# Patient Record
Sex: Female | Born: 1960 | Race: White | Hispanic: No | Marital: Married | Smoking: Former smoker
Health system: Southern US, Community
[De-identification: ages and names within clinical notes are randomized; demographics above are authoritative.]

---

## 2005-09-19 ENCOUNTER — Emergency Department: Payer: Self-pay | Admitting: Emergency Medicine

## 2006-10-30 IMAGING — CR DG RIBS 2V*L*
1 series · 3 of 3 positions shown · non-contrast
Comparison: none

REASON FOR EXAM: fall
COMMENTS:

PROCEDURE:     DXR - DXR RIBS LEFT UNILATERAL  - September 19, 2005 [DATE]
RESULT:          The patient sustained a fracture of the lateral aspect of
the LEFT 8th rib.  The 7th and 9th ribs are intact.  There is no pleural
effusion identified.  I see no pneumothorax.

[Series 1: view not recorded · 0.17mm/px · 3 of 3 slices shown]
[im 1/3]
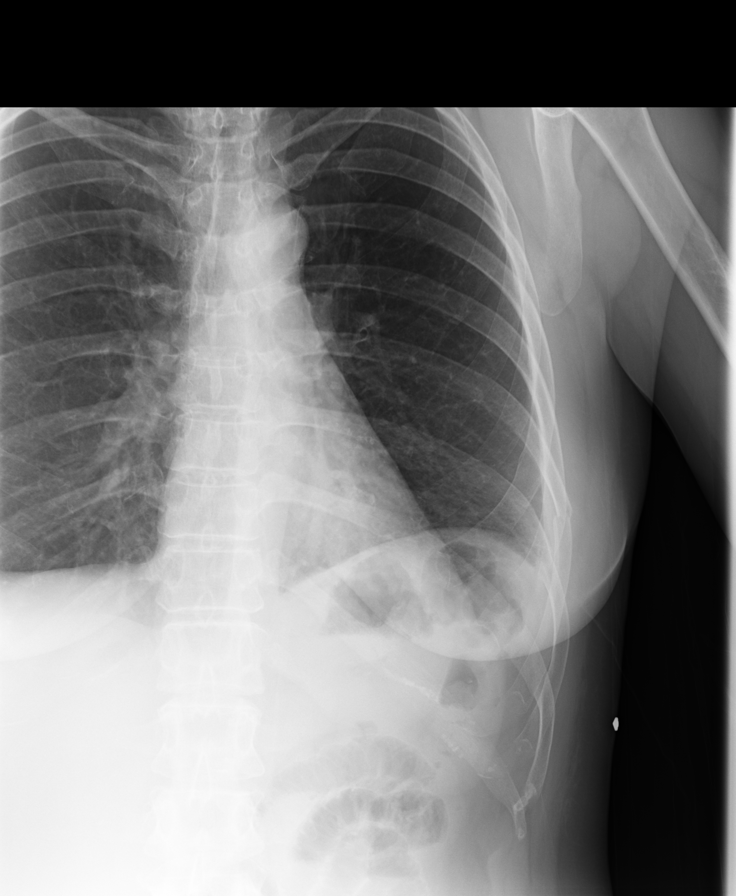
[im 2/3]
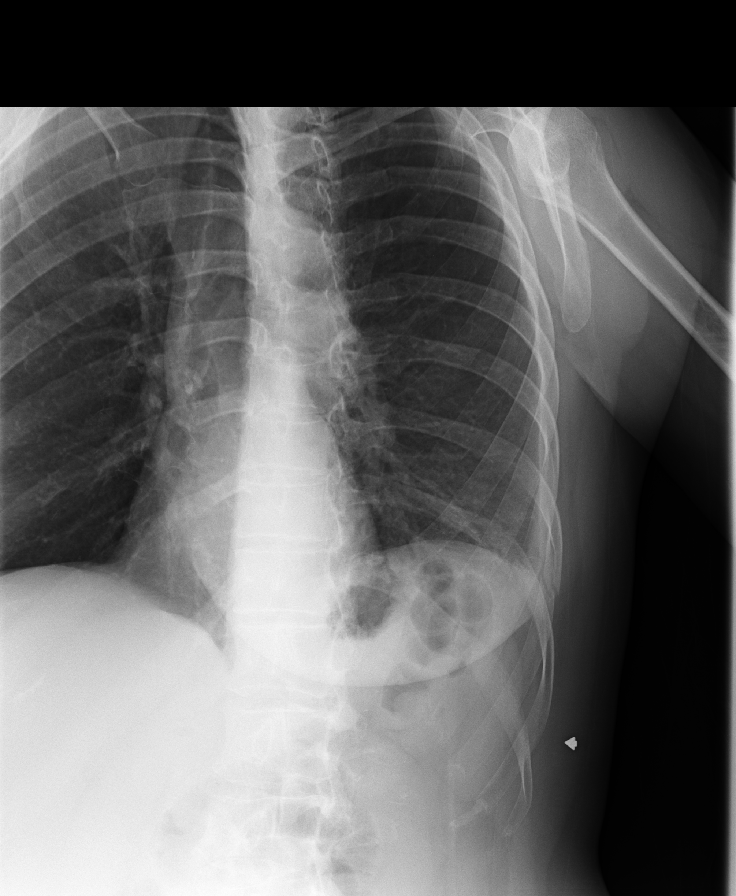
[im 3/3]
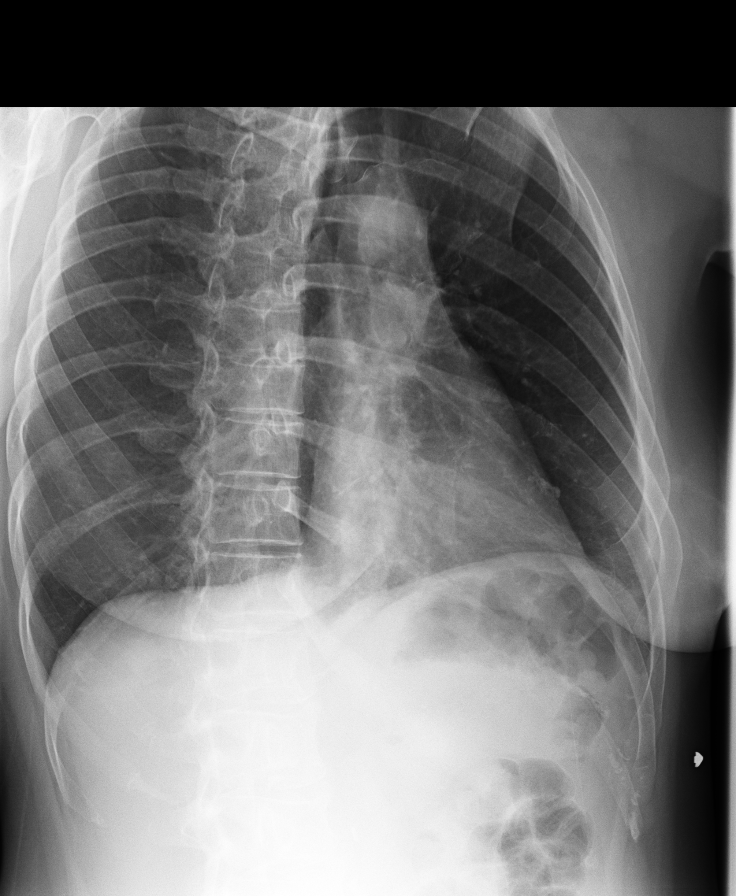

[3 of 3 positions shown; findings below may reference images not displayed]

IMPRESSION: The patient has sustained a fracture of the lateral
aspect of the LEFT 8th rib.

## 2017-10-17 ENCOUNTER — Other Ambulatory Visit: Payer: Self-pay

## 2017-10-17 ENCOUNTER — Emergency Department
Admission: EM | Admit: 2017-10-17 | Discharge: 2017-10-17 | Disposition: A | Discharge status: Home / Self Care | Payer: Self-pay | Attending: Emergency Medicine | Admitting: Emergency Medicine

## 2017-10-17 DIAGNOSIS — Z77098 Contact with and (suspected) exposure to other hazardous, chiefly nonmedicinal, chemicals: Secondary | ICD-10-CM | POA: Insufficient documentation

## 2017-10-17 DIAGNOSIS — Z87891 Personal history of nicotine dependence: Secondary | ICD-10-CM | POA: Insufficient documentation

## 2017-10-17 DIAGNOSIS — J3489 Other specified disorders of nose and nasal sinuses: Secondary | ICD-10-CM | POA: Insufficient documentation

## 2017-10-17 DIAGNOSIS — R0981 Nasal congestion: Secondary | ICD-10-CM | POA: Insufficient documentation

## 2017-10-17 DIAGNOSIS — R51 Headache: Secondary | ICD-10-CM | POA: Insufficient documentation

## 2017-10-17 DIAGNOSIS — R070 Pain in throat: Secondary | ICD-10-CM | POA: Insufficient documentation

## 2017-10-17 MED ORDER — PREDNISONE 10 MG PO TABS: 50.0000 mg | ORAL_TABLET | Freq: Every day | ORAL | 0 refills | Status: DC

## 2017-10-17 MED ORDER — NAPROXEN 500 MG PO TABS: 500.0000 mg | ORAL_TABLET | Freq: Two times a day (BID) | ORAL | 0 refills | Status: DC | PRN

## 2017-10-17 MED ORDER — LIDOCAINE VISCOUS HCL 2 % MT SOLN
15.0000 mL | Freq: Once | OROMUCOSAL | Status: AC
  Administered 2017-10-17: 15 mL via OROMUCOSAL
  Filled 2017-10-17: qty 15

## 2017-10-17 MED ORDER — ACETAMINOPHEN 325 MG PO TABS
650.0000 mg | ORAL_TABLET | Freq: Once | ORAL | Status: AC
  Administered 2017-10-17: 650 mg via ORAL
  Filled 2017-10-17: qty 2

## 2017-10-17 MED ORDER — PREDNISONE 20 MG PO TABS
60.0000 mg | ORAL_TABLET | Freq: Once | ORAL | Status: AC
  Administered 2017-10-17: 60 mg via ORAL
  Filled 2017-10-17: qty 3

## 2017-10-17 MED ORDER — LIDOCAINE VISCOUS HCL 2 % MT SOLN: 15.0000 mL | OROMUCOSAL | 0 refills | Status: DC | PRN

## 2017-10-17 NOTE — ED Triage Notes (Addendum)
Pt arrives to ED via POV from home with c/o headache and "burning in my throat" that started 1.5 hrs PTA. Pt denies any c/o N/V/D or fever. No visual changes, no tinnitus; no facial droop or unilateral weakness observed. Pt denies any difficulty breathing, talking, or swallowing. Pt also states she was using ammonia today when "cleaning up after a new puppy" before the s/x's began today.

## 2017-10-17 NOTE — Discharge Instructions (Addendum)
Please follow up with the primary care provider of your choice for symptoms that are not improving over the next few days. ° °Return to the ER for symptoms that change or worsen if unable to schedule an appointment. °

## 2017-10-17 NOTE — ED Notes (Signed)
Report given to Dajea RN. 

## 2017-10-17 NOTE — ED Provider Notes (Signed)
-----------------------------------------   11:34 PM on 10/17/2017 -----------------------------------------  I evaluated this patient with the FNP.  Patient reported throat discomfort after inhaling ammonia that she was using to clean.  On my exam, the patient's vital signs were normal, oropharynx was clear, there is no stridor, no pooled secretions, or other acute abnormalities.  Suspect most likely mild pharyngeal irritation due to the fumes.  We gave viscous lidocaine with improvement in the patient's symptoms.  No evidence for pneumonitis or other acute complications, and no indication for further ED work-up.   Dionne BucySiadecki, Syrenity Klepacki, MD 10/17/17 [phone]

## 2017-10-17 NOTE — ED Provider Notes (Signed)
[facility] Emergency Department Provider Note   ____________________________________________   None    (approximate)  I have reviewed the triage vital signs and the nursing notes.   HISTORY  Chief Complaint Headache and Sore Throat    HPI Maria Meyers is a 57 y.o. female who presents to the emergency department for treatment and evaluation of headache and throat burning that started about 2 hours ago.  Patient states that symptoms started just after cleaning with ammonia.  She had felt well until that time.  She states that she has had some rhinorrhea with blood streaks, sinus congestion, and pharyngitis.  She has not taken any medications or attempted any alleviating measures prior to arrival.  History reviewed. No pertinent past medical history.  There are no active problems to display for this patient.   Past Surgical History:  Procedure Laterality Date  . CESAREAN SECTION      Prior to Admission medications   Medication Sig Start Date End Date Taking? Authorizing Provider  lidocaine (XYLOCAINE) 2 % solution Use as directed 15 mLs in the mouth or throat as needed (throat irritation). 10/17/17   Asante Ritacco, Rulon Eisenmengerari B, FNP  naproxen (NAPROSYN) 500 MG tablet Take 1 tablet (500 mg total) by mouth 2 (two) times daily as needed for headache. 10/17/17   Johnchristopher Sarvis B, FNP  predniSONE (DELTASONE) 10 MG tablet Take 5 tablets (50 mg total) by mouth daily. 10/17/17   Kem Boroughsriplett, Randell Teare B, FNP    Allergies Codeine  No family history on file.  Social History Social History   Tobacco Use  . Smoking status: Former Games developermoker  . Smokeless tobacco: Current User  Substance Use Topics  . Alcohol use: Not on file  . Drug use: Not on file    Review of Systems  Constitutional: No fever/chills Eyes: No visual changes. ENT: Positive for sore throat. Cardiovascular: Denies chest pain. Respiratory: Denies shortness of breath. Gastrointestinal: No abdominal pain.   No nausea, no vomiting. Genitourinary: Negative for dysuria. Musculoskeletal: Negative for back pain. Skin: Negative for rash, lesion, or wound. Neurological: Positive for headaches,  Negative for focal weakness or numbness. ____________________________________________   PHYSICAL EXAM:  VITAL SIGNS: ED Triage Vitals  Enc Vitals Group     BP 10/17/17 2107 (!) 160/79     Pulse Rate 10/17/17 2107 (!) 57     Resp 10/17/17 2107 18     Temp 10/17/17 2107 98.1 F (36.7 C)     Temp Source 10/17/17 2107 Oral     SpO2 10/17/17 2107 98 %     Weight 10/17/17 2108 112 lb (50.8 kg)     Height 10/17/17 2108 5\' 3"  (1.6 m)     Head Circumference --      Peak Flow --      Pain Score 10/17/17 2108 5     Pain Loc --      Pain Edu? --      Excl. in GC? --     Constitutional: Alert and oriented. Well appearing and in no acute distress. Eyes: Conjunctivae are normal. PERRL. Head: Atraumatic. Nose: Sinus congestion without rhinnorhea. Mouth/Throat: Mucous membranes are moist.  Oropharynx non-erythematous. Airway is patent. Modified Mallampati Class II. Neck: No stridor.   Hematological/Lymphatic/Immunilogical: No cervical lymphadenopathy. Cardiovascular: Normal rate, regular rhythm. Grossly normal heart sounds.  Good peripheral circulation. Respiratory: Normal respiratory effort.  No retractions. Lungs CTAB. Gastrointestinal: Soft and nontender. No distention. No abdominal bruits. Musculoskeletal: No lower extremity tenderness nor edema.  No joint effusions. Neurologic:  Normal speech and language. No gross focal neurologic deficits are appreciated. No gait instability. Swallows on command without difficulty.  Skin:  Skin is warm, dry and intact. No rash noted. Psychiatric: Mood and affect are normal. Speech and behavior are normal.  ____________________________________________   LABS (all labs ordered are listed, but only abnormal results are displayed)  Labs Reviewed - No data to  display ____________________________________________  EKG  Not indicated. ____________________________________________  RADIOLOGY  ED MD interpretation: Not indicated  Official radiology report(s): No results found.  ____________________________________________   PROCEDURES  Procedure(s) performed: None  Procedures  Critical Care performed: No  ____________________________________________   INITIAL IMPRESSION / ASSESSMENT AND PLAN / ED COURSE  As part of my medical decision making, I reviewed the following data within the electronic MEDICAL RECORD NUMBER    57 year old female presenting to the emergency department for treatment and evaluation after inhaling some ammonia while cleaning up a mess her dog made.  While here, she was given viscous lidocaine and Tylenol.  She had resolution of her headache and some improvement of throat irritation.  Prior to discharge, she was given prednisone and will be given a prescription for the same.  She will also be given a prescription for the viscous lidocaine.  She was encouraged to follow-up with her primary care provider for symptoms that are not improving over the next 24 to 48 hours.  She was encouraged to return to return to the emergency department for symptoms of change or worsen if she is unable to schedule an appointment.      ____________________________________________   FINAL CLINICAL IMPRESSION(S) / ED DIAGNOSES  Final diagnoses:  Chemical exposure     ED Discharge Orders        Ordered    lidocaine (XYLOCAINE) 2 % solution  As needed     10/17/17 2324    naproxen (NAPROSYN) 500 MG tablet  2 times daily PRN     10/17/17 2324    predniSONE (DELTASONE) 10 MG tablet  Daily     10/17/17 2326       Note:  This document was prepared using Dragon voice recognition software and may include unintentional dictation errors.    Chinita Pester, FNP 10/18/17 1610    Dionne Bucy, MD 10/20/17 0004

## 2020-05-28 ENCOUNTER — Other Ambulatory Visit: Payer: Self-pay

## 2020-05-28 ENCOUNTER — Emergency Department: Payer: Self-pay

## 2020-05-28 ENCOUNTER — Encounter: Payer: Self-pay | Admitting: Emergency Medicine

## 2020-05-28 ENCOUNTER — Emergency Department
Admission: EM | Admit: 2020-05-28 | Discharge: 2020-05-28 | Disposition: A | Discharge status: Home / Self Care | Payer: Self-pay | Attending: Student in an Organized Health Care Education/Training Program | Admitting: Student in an Organized Health Care Education/Training Program

## 2020-05-28 DIAGNOSIS — Z87891 Personal history of nicotine dependence: Secondary | ICD-10-CM | POA: Insufficient documentation

## 2020-05-28 DIAGNOSIS — R072 Precordial pain: Secondary | ICD-10-CM | POA: Insufficient documentation

## 2020-05-28 DIAGNOSIS — R61 Generalized hyperhidrosis: Secondary | ICD-10-CM | POA: Insufficient documentation

## 2020-05-28 DIAGNOSIS — R079 Chest pain, unspecified: Secondary | ICD-10-CM

## 2020-05-28 DIAGNOSIS — R200 Anesthesia of skin: Secondary | ICD-10-CM | POA: Insufficient documentation

## 2020-05-28 LAB — COMPREHENSIVE METABOLIC PANEL
ALT: 18 U/L (ref 0–44)
AST: 24 U/L (ref 15–41)
Albumin: 4 g/dL (ref 3.5–5.0)
Alkaline Phosphatase: 68 U/L (ref 38–126)
Anion gap: 7 (ref 5–15)
BUN: 16 mg/dL (ref 8–23)
CO2: 26 mmol/L (ref 22–32)
Calcium: 9.2 mg/dL (ref 8.9–10.3)
Chloride: 105 mmol/L (ref 98–111)
Creatinine, Ser: 0.77 mg/dL (ref 0.44–1.00)
GFR, Estimated: >60 mL/min (ref >60)
Glucose, Bld: 88 mg/dL (ref 70–99)
Potassium: 4.1 mmol/L (ref 3.5–5.1)
Sodium: 138 mmol/L (ref 135–145)
Total Bilirubin: 0.7 mg/dL (ref 0.3–1.2)
Total Protein: 7.1 g/dL (ref 6.5–8.1)

## 2020-05-28 LAB — CBC WITH DIFFERENTIAL/PLATELET
Abs Immature Granulocytes: 0.01 10*3/uL (ref 0.00–0.07)
Basophils Absolute: 0 10*3/uL (ref 0.0–0.1)
Basophils Relative: 1 %
Eosinophils Absolute: 0.2 10*3/uL (ref 0.0–0.5)
Eosinophils Relative: 4 %
HCT: 41.9 % (ref 36.0–46.0)
Hemoglobin: 14.1 g/dL (ref 12.0–15.0)
Immature Granulocytes: 0 %
Lymphocytes Relative: 31 %
Lymphs Abs: 1.3 10*3/uL (ref 0.7–4.0)
MCH: 33.5 pg (ref 26.0–34.0)
MCHC: 33.7 g/dL (ref 30.0–36.0)
MCV: 99.5 fL (ref 80.0–100.0)
Monocytes Absolute: 0.5 10*3/uL (ref 0.1–1.0)
Monocytes Relative: 11 %
Neutro Abs: 2.1 10*3/uL (ref 1.7–7.7)
Neutrophils Relative %: 53 %
Platelets: 240 10*3/uL (ref 150–400)
RBC: 4.21 MIL/uL (ref 3.87–5.11)
RDW: 12.1 % (ref 11.5–15.5)
WBC: 4.1 10*3/uL (ref 4.0–10.5)
nRBC: 0 % (ref 0.0–0.2)

## 2020-05-28 LAB — TROPONIN I (HIGH SENSITIVITY)
Troponin I (High Sensitivity): 2 ng/L (ref <18)
Troponin I (High Sensitivity): 2 ng/L (ref <18)

## 2020-05-28 MED ORDER — AMLODIPINE BESYLATE 5 MG PO TABS
5.0000 mg | ORAL_TABLET | Freq: Once | ORAL | Status: AC
  Administered 2020-05-28: 5 mg via ORAL
  Filled 2020-05-28: qty 1

## 2020-05-28 MED ORDER — IOHEXOL 350 MG/ML SOLN
75.0000 mL | Freq: Once | INTRAVENOUS | Status: AC | PRN
  Administered 2020-05-28: 75 mL via INTRAVENOUS
  Filled 2020-05-28: qty 75

## 2020-05-28 MED ORDER — ASPIRIN 81 MG PO CHEW
324.0000 mg | CHEWABLE_TABLET | Freq: Once | ORAL | Status: AC
  Administered 2020-05-28: 324 mg via ORAL
  Filled 2020-05-28: qty 4

## 2020-05-28 MED ORDER — AMLODIPINE BESYLATE 5 MG PO TABS: 5.0000 mg | ORAL_TABLET | Freq: Every day | ORAL | 1 refills | Status: DC

## 2020-05-28 NOTE — ED Notes (Signed)
Pt co cp and numbness in the left arm. She states that the pain work her up at 4am. The pt says the numbness has happened before but went away, denies no other symptoms with the pain.

## 2020-05-28 NOTE — ED Provider Notes (Signed)
[facility] Emergency Department Provider Note    Event Date/Time   First MD Initiated Contact with Patient 05/28/20 [phone]     (approximate)  I have reviewed the triage vital signs and the nursing notes.   HISTORY  Chief Complaint Chest Pain    HPI SOLE LENGACHER is a 60 y.o. female no significant past medical history other than history of smoking presents to the ER for evaluation of midsternal chest pain and pressure associated with left arm heaviness and numbness pain the symptoms awoke her from sleep around 4 AM and lasted until 6.  She did have associated diaphoresis.  States the pain did abate by itself.  States that she woke up with the diaphoresis and was not feeling clammy or diaphoretic for the entire duration of the pain.  States the pain would get worse and then abate get worse and then abate several times over the course of 2 hours.  Has never had pain like this before.    History reviewed. No pertinent past medical history. No family history on file. Past Surgical History:  Procedure Laterality Date  . CESAREAN SECTION     There are no problems to display for this patient.     Prior to Admission medications   Medication Sig Start Date End Date Taking? Authorizing Provider  lidocaine (XYLOCAINE) 2 % solution Use as directed 15 mLs in the mouth or throat as needed (throat irritation). 10/17/17   Triplett, Rulon Eisenmenger B, FNP  naproxen (NAPROSYN) 500 MG tablet Take 1 tablet (500 mg total) by mouth 2 (two) times daily as needed for headache. 10/17/17   Triplett, Cari B, FNP  predniSONE (DELTASONE) 10 MG tablet Take 5 tablets (50 mg total) by mouth daily. 10/17/17   Chinita Pester, FNP    Allergies Codeine    Social History Social History   Tobacco Use  . Smoking status: Former Games developer  . Smokeless tobacco: Current User  Vaping Use  . Vaping Use: Every day    Review of Systems Patient denies headaches, rhinorrhea, blurry vision, numbness,  shortness of breath, chest pain, edema, cough, abdominal pain, nausea, vomiting, diarrhea, dysuria, fevers, rashes or hallucinations unless otherwise stated above in HPI. ____________________________________________   PHYSICAL EXAM:  VITAL SIGNS: Vitals:   05/28/20 0629  BP: (!) 161/89  Pulse: 67  Resp: 18  Temp: 98 F (36.7 C)  SpO2: 100%    Constitutional: Alert and oriented.  Eyes: Conjunctivae are normal.  Head: Atraumatic. Nose: No congestion/rhinnorhea. Mouth/Throat: Mucous membranes are moist.   Neck: No stridor. Painless ROM.  Cardiovascular: Normal rate, regular rhythm. Grossly normal heart sounds.  Good peripheral circulation. Respiratory: Normal respiratory effort.  No retractions. Lungs CTAB. Gastrointestinal: Soft and nontender. No distention. No abdominal bruits. No CVA tenderness. Genitourinary:  Musculoskeletal: No lower extremity tenderness nor edema.  No joint effusions. Neurologic:  Normal speech and language. No gross focal neurologic deficits are appreciated. No facial droop Skin:  Skin is warm, dry and intact. No rash noted. Psychiatric: Mood and affect are normal. Speech and behavior are normal.  ____________________________________________   LABS (all labs ordered are listed, but only abnormal results are displayed)  Results for orders placed or performed during the hospital encounter of 05/28/20 (from the past 24 hour(s))  CBC with Differential     Status: None   Collection Time: 05/28/20  6:34 AM  Result Value Ref Range   WBC 4.1 4.0 - 10.5 K/uL   RBC 4.21 3.87 -  5.11 MIL/uL   Hemoglobin 14.1 12.0 - 15.0 g/dL   HCT 41.3 24.4 - 01.0 %   MCV 99.5 80.0 - 100.0 fL   MCH 33.5 26.0 - 34.0 pg   MCHC 33.7 30.0 - 36.0 g/dL   RDW 27.2 53.6 - 64.4 %   Platelets 240 150 - 400 K/uL   nRBC 0.0 0.0 - 0.2 %   Neutrophils Relative % 53 %   Neutro Abs 2.1 1.7 - 7.7 K/uL   Lymphocytes Relative 31 %   Lymphs Abs 1.3 0.7 - 4.0 K/uL   Monocytes Relative 11 %    Monocytes Absolute 0.5 0.1 - 1.0 K/uL   Eosinophils Relative 4 %   Eosinophils Absolute 0.2 0.0 - 0.5 K/uL   Basophils Relative 1 %   Basophils Absolute 0.0 0.0 - 0.1 K/uL   Immature Granulocytes 0 %   Abs Immature Granulocytes 0.01 0.00 - 0.07 K/uL  Comprehensive metabolic panel     Status: None   Collection Time: 05/28/20  6:34 AM  Result Value Ref Range   Sodium 138 135 - 145 mmol/L   Potassium 4.1 3.5 - 5.1 mmol/L   Chloride 105 98 - 111 mmol/L   CO2 26 22 - 32 mmol/L   Glucose, Bld 88 70 - 99 mg/dL   BUN 16 8 - 23 mg/dL   Creatinine, Ser 0.34 0.44 - 1.00 mg/dL   Calcium 9.2 8.9 - 74.2 mg/dL   Total Protein 7.1 6.5 - 8.1 g/dL   Albumin 4.0 3.5 - 5.0 g/dL   AST 24 15 - 41 U/L   ALT 18 0 - 44 U/L   Alkaline Phosphatase 68 38 - 126 U/L   Total Bilirubin 0.7 0.3 - 1.2 mg/dL   GFR, Estimated >59 >56 mL/min   Anion gap 7 5 - 15  Troponin I (High Sensitivity)     Status: None   Collection Time: 05/28/20  6:34 AM  Result Value Ref Range   Troponin I (High Sensitivity) 2 <18 ng/L   ____________________________________________  EKG My review and personal interpretation at Time: 6:26   Indication: chestpain  Rate: 65  Rhythm: sinus Axis: normal Other: normal intervals, no stemi ____________________________________________  RADIOLOGY  I personally reviewed all radiographic images ordered to evaluate for the above acute complaints and reviewed radiology reports and findings.  These findings were personally discussed with the patient.  Please see medical record for radiology report.  ____________________________________________   PROCEDURES  Procedure(s) performed:  Procedures    Critical Care performed: no ____________________________________________   INITIAL IMPRESSION / ASSESSMENT AND PLAN / ED COURSE  Pertinent labs & imaging results that were available during my care of the patient were reviewed by me and considered in my medical decision making (see chart  for details).   DDX: ACS, pericarditis, pe, dissection, pna, bronchitis, costochondritis   DORSIE SETHI is a 60 y.o. who presents to the ED with presentation as described above.  Patient is currently pain-free nontoxic-appearing.  EKG appears normal initial troponin is negative.  No respiratory distress no hypoxia.  Chest x-ray with no evidence of pneumothorax or infiltrate.  Patient her pain going through to her neck and arm, described numbness will order CTA to evaluate for acute aortic pathology.  Clinical Course as of 05/28/20 0933  Wed May 28, 2020  [id number] CTA fortunately shows no evidence of acute abnormality but does have some aortic atherosclerosis.  Given her age risk factors and description of event I am in  a consult with cardiology.  Serial enzymes are negative therefore have a lower suspicion for ACS. [PR]  [phone] Patient remains pain-free.  Discussed case in consultation with Dr. Gwen Pounds.  Agrees to see patient in close outpatient follow-up.  Patient demonstrates understanding agreeable with plan. [PR]    Clinical Course User Index [PR] Willy Eddy, MD    The patient was evaluated in Emergency Department today for the symptoms described in the history of present illness. He/she was evaluated in the context of the global COVID-19 pandemic, which necessitated consideration that the patient might be at risk for infection with the SARS-CoV-2 virus that causes COVID-19. Institutional protocols and algorithms that pertain to the evaluation of patients at risk for COVID-19 are in a state of rapid change based on information released by regulatory bodies including the CDC and federal and state organizations. These policies and algorithms were followed during the patient's care in the ED.  As part of my medical decision making, I reviewed the following data within the electronic MEDICAL RECORD NUMBER Nursing notes reviewed and incorporated, Labs reviewed, notes from prior ED visits and [place]  Controlled Substance Database   ____________________________________________   FINAL CLINICAL IMPRESSION(S) / ED DIAGNOSES  Final diagnoses:  Chest pain, unspecified type      NEW MEDICATIONS STARTED DURING THIS VISIT:  New Prescriptions   No medications on file     Note:  This document was prepared using Dragon voice recognition software and may include unintentional dictation errors.    Willy Eddy, MD 05/28/20 [phone]

## 2020-05-28 NOTE — ED Triage Notes (Signed)
Pt to triage via w/c with no distress noted; st awoke at 4am with upper CP radiating into left arm & neck; denies any accomp symptoms, denies hx of same

## 2020-05-29 DIAGNOSIS — I10 Essential (primary) hypertension: Secondary | ICD-10-CM | POA: Insufficient documentation

## 2020-05-29 DIAGNOSIS — I2089 Other forms of angina pectoris: Secondary | ICD-10-CM | POA: Insufficient documentation

## 2020-05-30 ENCOUNTER — Other Ambulatory Visit: Payer: Self-pay | Admitting: Internal Medicine

## 2020-05-30 DIAGNOSIS — I208 Other forms of angina pectoris: Secondary | ICD-10-CM

## 2020-06-10 ENCOUNTER — Other Ambulatory Visit: Payer: Self-pay

## 2020-06-10 ENCOUNTER — Ambulatory Visit
Admission: RE | Admit: 2020-06-10 | Discharge: 2020-06-10 | Disposition: A | Discharge status: Home / Self Care | Payer: Self-pay | Source: Ambulatory Visit | Attending: Internal Medicine | Admitting: Internal Medicine

## 2020-06-10 DIAGNOSIS — I2089 Other forms of angina pectoris: Secondary | ICD-10-CM

## 2020-06-10 DIAGNOSIS — I208 Other forms of angina pectoris: Secondary | ICD-10-CM | POA: Insufficient documentation

## 2020-06-10 LAB — NM MYOCAR MULTI W/SPECT W/WALL MOTION / EF
Estimated workload: 6.2 METS
Exercise duration (min): 4 min
Exercise duration (sec): 22 s
LV dias vol: 33 mL (ref 46–106)
LV sys vol: 10 mL
MPHR: 159 {beats}/min
Peak HR: 126 {beats}/min
Percent HR: 79 %
Rest HR: 59 {beats}/min
SDS: 1
SRS: 4
SSS: 0
TID: 0.94

## 2020-06-10 MED ORDER — TECHNETIUM TC 99M TETROFOSMIN IV KIT
30.0000 | PACK | Freq: Once | INTRAVENOUS | Status: AC | PRN
  Administered 2020-06-10: 30.076 via INTRAVENOUS

## 2020-06-10 MED ORDER — TECHNETIUM TC 99M TETROFOSMIN IV KIT
10.0000 | PACK | Freq: Once | INTRAVENOUS | Status: AC | PRN
  Administered 2020-06-10: 10.92 via INTRAVENOUS

## 2022-03-17 DIAGNOSIS — E782 Mixed hyperlipidemia: Secondary | ICD-10-CM | POA: Insufficient documentation

## 2023-05-30 ENCOUNTER — Ambulatory Visit: Payer: Self-pay

## 2023-05-30 NOTE — Telephone Encounter (Signed)
 Copied from CRM [id number]. Topic: Clinical - Red Word Triage >> May 30, 2023 12:46 PM Antony Haste wrote: Red Word that prompted transfer to Nurse Triage: Swelling in left foot during the day - constant pain and irritation especially while walking/standing.  Chief Complaint: Swollen/painful left foot Symptoms: Redness Frequency: A few days Pertinent Negatives: Patient denies fever Disposition: [] ED /[] Urgent Care (no appt availability in office) / [x] Appointment(In office/virtual)/ []  [facility]/ [] Home Care/ [] Refused Recommended Disposition /[] [place] [facility]/ []  Follow-up with PCP Additional Notes: Patient called in reporting pain and swelling in her left foot. Patient denied a recent fall/accident that may have caused these symptoms. Patient also stated the area is red. Patient reported a small knot on the inside of the foot. Patient denied swelling in the rest of her leg. Patient stated she is having to drag her foot or limp to walk. This RN advised patient to see a provider within 24 hours, per protocol. Patient is not currently an established patient and is looking to establish care at [facility]. This RN scheduled a new patient appointment for the patient to establish care. This RN advised patient to go to an UC in the meantime for treatment of her foot. Patient complied and stated she would go to a walk-in clinic today. This RN advised patient to call back if symptoms worsen. Patient complied.   Reason for Disposition  [1] Swollen foot AND [2] no fever  (Exceptions: localized bump from bunions, calluses, insect bite, sting)  Answer Assessment - Initial Assessment Questions 1. ONSET: "When did the pain start?"      "The first of the week" 2. LOCATION: "Where is the pain located?"      Left foot 3. PAIN: "How bad is the pain?"    (Scale 1-10; or mild, moderate, severe)  - MILD (1-3): doesn't interfere with normal activities.   - MODERATE (4-7): interferes with  normal activities (e.g., work or school) or awakens from sleep, limping.   - SEVERE (8-10): excruciating pain, unable to do any normal activities, unable to walk.      States foot is sore to the touch and it hurts to stand, pain 6-7 when walking, denies pain at rest, states she is having to drag foot/limp 4. WORK OR EXERCISE: "Has there been any recent work or exercise that involved this part of the body?"      Denies 5. CAUSE: "What do you think is causing the foot pain?"     Unknown 6. OTHER SYMPTOMS: "Do you have any other symptoms?" (e.g., leg pain, rash, fever, numbness)     Swelling, redness, 1 inch knot on inside of foot below ankle  Protocols used: Foot Pain-A-AH

## 2023-07-14 ENCOUNTER — Encounter: Payer: Self-pay | Admitting: Emergency Medicine

## 2023-07-14 ENCOUNTER — Encounter: Payer: Self-pay | Admitting: Pediatrics

## 2023-07-14 ENCOUNTER — Ambulatory Visit (INDEPENDENT_AMBULATORY_CARE_PROVIDER_SITE_OTHER): Payer: Self-pay | Admitting: Pediatrics

## 2023-07-14 VITALS — BP 125/71 | HR 56 | Temp 97.9°F | Ht 62.0 in | Wt 107.4 lb

## 2023-07-14 DIAGNOSIS — I10 Essential (primary) hypertension: Secondary | ICD-10-CM

## 2023-07-14 DIAGNOSIS — I252 Old myocardial infarction: Secondary | ICD-10-CM | POA: Insufficient documentation

## 2023-07-14 DIAGNOSIS — Z7689 Persons encountering health services in other specified circumstances: Secondary | ICD-10-CM

## 2023-07-14 MED ORDER — AMLODIPINE BESYLATE 5 MG PO TABS: 5.0000 mg | ORAL_TABLET | Freq: Every day | ORAL | 3 refills | Status: DC

## 2023-07-14 NOTE — Progress Notes (Signed)
 Office Visit  BP 125/71   Pulse (!) 56   Temp 97.9 F (36.6 C) (Oral)   Ht 5\' 2"  (1.575 m)   Wt 107 lb 6.4 oz (48.7 kg)   SpO2 95%   BMI 19.64 kg/m    Subjective:    Patient ID: Steva Ready, female    DOB: May 22, 1960, 63 y.o.   MRN: [id number]  HPI: TESSIE ORDAZ is a 63 y.o. female  Chief Complaint  Patient presents with   Establish Care    Discussed the use of AI scribe software for clinical note transcription with the patient, who gave verbal consent to proceed.  History of Present Illness   ASLEY BASKERVILLE is a 63 year old female with hypertension who presents for medication management and follow-up.  Her blood pressure is well controlled on 5 mg of amlodipine daily. She discontinued daily baby aspirin due to excessive bruising and bleeding but occasionally takes it now. She sometimes skips amlodipine for a couple of days without noticing adverse effects.  Approximately three years ago, she experienced arm pain that led to an emergency room visit. A cardiologist conducted various tests, including stress tests, which showed no significant issues. She has not experienced similar symptoms since then.  About six weeks ago, she noticed significant swelling in her foot, evaluated at the [facility] clinic. X-rays indicated arthritis, and she was given a seven-day course of medication. The swelling persists, particularly when it rains, but it is not painful.  She is currently uninsured but expects to receive Medicaid in September. [place] recently retired and remains [place] for [place] and dogs. No recent heart attacks or strokes, and no current symptoms of dizziness or fatigue.       Relevant past medical, surgical, family and social history reviewed and updated as indicated. Interim medical history since our last visit reviewed. Allergies and medications reviewed and updated.  ROS per HPI unless specifically indicated above      Objective:    BP 125/71   Pulse (!) 56   Temp 97.9 F (36.6 C) (Oral)   Ht 5\' 2"  (1.575 m)   Wt 107 lb 6.4 oz (48.7 kg)   SpO2 95%   BMI 19.64 kg/m   Wt Readings from Last 3 Encounters:  07/14/23 107 lb 6.4 oz (48.7 kg)  05/28/20 110 lb (49.9 kg)  10/17/17 112 lb (50.8 kg)     Physical Exam Constitutional:      Appearance: Normal appearance.  HENT:     Head: Normocephalic and atraumatic.  Eyes:     Pupils: Pupils are equal, round, and reactive to light.  Cardiovascular:     Rate and Rhythm: Regular rhythm. Bradycardia present.     Pulses: Normal pulses.     Heart sounds: Normal heart sounds.  Pulmonary:     Effort: Pulmonary effort is normal.     Breath sounds: Normal breath sounds.  Musculoskeletal:        General: Normal range of motion.     Cervical back: Normal range of motion.  Skin:    General: Skin is warm and dry.     Capillary Refill: Capillary refill takes less than 2 seconds.  Neurological:     General: No focal deficit present.     Mental Status: She is alert. Mental status is at baseline.  Psychiatric:        Mood and Affect: Mood normal.  Behavior: Behavior normal.         07/14/2023    8:53 AM  Depression screen PHQ 2/9  Decreased Interest 0  Down, Depressed, Hopeless 0  PHQ - 2 Score 0  Altered sleeping 0  Tired, decreased energy 0  Change in appetite 0  Feeling bad or failure about yourself  0  Trouble concentrating 0  Moving slowly or fidgety/restless 0  Suicidal thoughts 0  PHQ-9 Score 0  Difficult doing work/chores Not difficult at all       07/14/2023    8:53 AM  GAD 7 : Generalized Anxiety Score  Nervous, Anxious, on Edge 0  Control/stop worrying 0  Worry too much - different things 0  Trouble relaxing 0  Restless 0  Easily annoyed or irritable 0  Afraid - awful might happen 0  Total GAD 7 Score 0  Anxiety Difficulty Not difficult at all       Assessment & Plan:  Assessment & Plan   Primary  hypertension Assessment & Plan: Blood pressure controlled on amlodipine 5 mg. Discussed trial discontinuation to assess control without medication. Emphasized monitoring and potential symptoms of hypotension. - Discontinue amlodipine for one week, monitor blood pressure daily. - Ensure blood pressure remains below 140/90 mmHg. - Assistant to call next week for blood pressure readings. - Send 90-day amlodipine prescription to [facility] as backup. - Schedule follow-up in October after Medicare coverage begins.  Orders: -     amLODIPine Besylate; Take 1 tablet (5 mg total) by mouth daily.  Dispense: 90 tablet; Refill: 3  History of MI (myocardial infarction) Assessment & Plan: Previously followed by cardiology. Diagnosed by perfusion study. Once gets coverage, can proceed with re-establishing with cardiology. No recent symptoms. Infrequent use of ASA.   Encounter to establish care Reviewed available patient record including history, medications, problem list. HM updated as able. Will review and/or request outside records (if applicable) and will fill remaining HM gaps as needed at follow up visit. Medicare initial at follow up.   Follow up plan: Return in about 6 months (around 01/02/2024) for [employer].  Hadassah Letters, MD

## 2023-07-14 NOTE — Patient Instructions (Addendum)
 Blood pressure <130/80.   Medicare to look into: A and B + supplemental coverage.  Good to meet you! Welcome to [facility]!  As your primary care doctor, I look forward to working with you to help you reach your health goals.  Please be aware of a couple of logistical items: - If you message me on mychart, it may take me 1-2 business days to get back to you. This is for non-urgent messaging.  - If you require urgent clinical attention, please call the clinic or present to urgent care/emergency room - If you have labs, I typically will send a message about them in 1-2 business days. - I am not here on Mondays, otherwise will be available from Tuesday-Friday during 8a-5pm.

## 2023-07-14 NOTE — Assessment & Plan Note (Signed)
 Blood pressure controlled on amlodipine 5 mg. Discussed trial discontinuation to assess control without medication. Emphasized monitoring and potential symptoms of hypotension. - Discontinue amlodipine for one week, monitor blood pressure daily. - Ensure blood pressure remains below 140/90 mmHg. - Assistant to call next week for blood pressure readings. - Send 90-day amlodipine prescription to [facility] as backup. - Schedule follow-up in October after Medicare coverage begins.

## 2023-07-14 NOTE — Assessment & Plan Note (Signed)
 Previously followed by cardiology. Diagnosed by perfusion study. Once gets coverage, can proceed with re-establishing with cardiology. No recent symptoms. Infrequent use of ASA.

## 2023-07-18 ENCOUNTER — Other Ambulatory Visit: Payer: Self-pay

## 2023-07-18 DIAGNOSIS — Z1231 Encounter for screening mammogram for malignant neoplasm of breast: Secondary | ICD-10-CM

## 2023-07-18 NOTE — Progress Notes (Signed)
Patient due for mammogram.

## 2023-07-19 ENCOUNTER — Ambulatory Visit: Payer: Self-pay

## 2024-01-03 DIAGNOSIS — F17290 Nicotine dependence, other tobacco product, uncomplicated: Secondary | ICD-10-CM | POA: Diagnosis not present

## 2024-01-03 DIAGNOSIS — Z1211 Encounter for screening for malignant neoplasm of colon: Secondary | ICD-10-CM | POA: Diagnosis not present

## 2024-01-03 DIAGNOSIS — F17211 Nicotine dependence, cigarettes, in remission: Secondary | ICD-10-CM | POA: Diagnosis not present

## 2024-01-03 DIAGNOSIS — Z008 Encounter for other general examination: Secondary | ICD-10-CM | POA: Diagnosis not present

## 2024-01-03 DIAGNOSIS — Z681 Body mass index (BMI) 19 or less, adult: Secondary | ICD-10-CM | POA: Diagnosis not present

## 2024-01-03 DIAGNOSIS — I10 Essential (primary) hypertension: Secondary | ICD-10-CM | POA: Diagnosis not present

## 2024-01-17 ENCOUNTER — Encounter: Payer: Self-pay | Admitting: Pediatrics

## 2024-01-17 ENCOUNTER — Ambulatory Visit (INDEPENDENT_AMBULATORY_CARE_PROVIDER_SITE_OTHER): Payer: Self-pay | Admitting: Pediatrics

## 2024-01-17 VITALS — BP 137/77 | HR 59 | Temp 97.6°F | Ht 62.0 in | Wt 103.0 lb

## 2024-01-17 DIAGNOSIS — E782 Mixed hyperlipidemia: Secondary | ICD-10-CM | POA: Diagnosis not present

## 2024-01-17 DIAGNOSIS — I10 Essential (primary) hypertension: Secondary | ICD-10-CM | POA: Diagnosis not present

## 2024-01-17 DIAGNOSIS — Z0000 Encounter for general adult medical examination without abnormal findings: Secondary | ICD-10-CM

## 2024-01-17 DIAGNOSIS — Z13820 Encounter for screening for osteoporosis: Secondary | ICD-10-CM

## 2024-01-17 DIAGNOSIS — I252 Old myocardial infarction: Secondary | ICD-10-CM | POA: Diagnosis not present

## 2024-01-17 NOTE — Patient Instructions (Addendum)
 Maria Meyers , Thank you for taking time to come for your Medicare Wellness Visit. I appreciate your ongoing commitment to your health goals. Please review the following plan we discussed and let me know if I can assist you in the future.   These are the goals we discussed:  Goals   None     This is a list of the screening recommended for you and due dates:  Health Maintenance  Topic Date Due   HIV Screening  Never done   Hepatitis C Screening  Never done   Pap with HPV screening  Never done   Breast Cancer Screening  Never done   Colon Cancer Screening  Never done   DEXA scan (bone density measurement)  Never done   COVID-19 Vaccine (1 - 2025-26 season) 02/02/2024*   Zoster (Shingles) Vaccine (1 of 2) 04/18/2024*   Flu Shot  06/26/2024*   DTaP/Tdap/Td vaccine (1 - Tdap) 01/16/2025*   Pneumococcal Vaccine for age over 63 (1 of 2 - PCV) 01/16/2025*   Medicare Annual Wellness Visit  01/16/2025   Hepatitis B Vaccine  Aged Out   Meningitis B Vaccine  Aged Out  *Topic was postponed. The date shown is not the original due date.   You have an order for:  []   2D Mammogram  []   3D Mammogram  [x]   Bone Density     Please call for appointment:  [facility]  [address] [place] [address] [phone]   [facility] [address] [place], [place] [address] [phone]   [facility] [address] [place], [place] [address] [phone]   Make sure to wear two-piece clothing.  No lotions, powders, or deodorants the day of the appointment. Make sure to bring picture ID and insurance card.  Bring list of medications you are currently taking including any supplements.   Schedule your [place] screening mammogram through MyChart!   Log into your MyChart account.  Go to 'Visit' (or 'Appointments' if on mobile App) --> Schedule an  Appointment  Under 'Select a Reason for Visit' choose the Mammogram Screening option.  Complete the pre-visit questions and select the time and place that best fits your schedule.

## 2024-01-17 NOTE — Progress Notes (Signed)
 Subjective:    Maria Meyers is a 63 y.o. female who presents for a Welcome to Medicare exam.   Cardiac Risk Factors include: advanced age (>52men, >22 women);hypertension      Objective:    Today's Vitals   01/17/24 0842 01/17/24 0843  BP:  137/77  Pulse:  (!) 59  Temp:  97.6 F (36.4 C)  TempSrc:  Oral  SpO2:  98%  Weight: 103 lb (46.7 kg) 103 lb (46.7 kg)  Height: 5' 2 (1.575 m) 5' 2 (1.575 m)  PainSc: 0-No pain 0-No pain  Body mass index is 18.84 kg/m.  Medications Outpatient Encounter Medications as of 01/17/2024  Medication Sig   Multiple Vitamins-Minerals (ALIVE MULTI-VITAMIN PO) Take 1 tablet by mouth daily at 2 PM.   [DISCONTINUED] amLODipine  (NORVASC ) 5 MG tablet Take 1 tablet (5 mg total) by mouth daily.   [DISCONTINUED] lidocaine  (XYLOCAINE ) 2 % solution Use as directed 15 mLs in the mouth or throat as needed (throat irritation). (Patient not taking: Reported on 07/14/2023)   [DISCONTINUED] naproxen  (NAPROSYN ) 500 MG tablet Take 1 tablet (500 mg total) by mouth 2 (two) times daily as needed for headache. (Patient not taking: Reported on 07/14/2023)   [DISCONTINUED] predniSONE  (DELTASONE ) 10 MG tablet Take 5 tablets (50 mg total) by mouth daily. (Patient not taking: Reported on 07/14/2023)   No facility-administered encounter medications on file as of 01/17/2024.     History: History reviewed. No pertinent past medical history. Past Surgical History:  Procedure Laterality Date   CESAREAN SECTION      History reviewed. No pertinent family history. Social History   Occupational History   Not on file  Tobacco Use   Smoking status: Former   Smokeless tobacco: Current  Vaping Use   Vaping status: Every Day   Substances: Nicotine  Substance and Sexual Activity   Alcohol use: Yes    Alcohol/week: 7.0 standard drinks of alcohol    Types: 7 Glasses of wine per week   Drug use: Not Currently   Sexual activity: Not on file    Tobacco Counseling Ready  to quit: Not Answered Counseling given: Not Answered   Immunizations and Health Maintenance  There is no immunization history on file for this patient. Health Maintenance Due  Topic Date Due   HIV Screening  Never done   Hepatitis C Screening  Never done   Cervical Cancer Screening (HPV/Pap Cotest)  Never done   Mammogram  Never done   Fecal DNA (Cologuard)  Never done   DEXA SCAN  Never done    Activities of Daily Living    01/17/2024    8:44 AM  In your present state of health, do you have any difficulty performing the following activities:  Hearing? 1  Comment left ear has decreased hearing  Vision? 0  Comment uses readers for reading up close  Difficulty concentrating or making decisions? 0  Walking or climbing stairs? 0  Dressing or bathing? 0  Doing errands, shopping? 0  Preparing Food and eating ? N  Using the Toilet? N  In the past six months, have you accidently leaked urine? N  Do you have problems with loss of bowel control? N  Managing your Medications? N  Managing your Finances? N  Housekeeping or managing your Housekeeping? N    Physical Exam   Physical Exam (optional), or other factors deemed appropriate based on the beneficiary's medical and social history and current clinical standards.   Advanced Directives: Does  Patient Have a Medical Advance Directive?: No Would patient like information on creating a medical advance directive?: No - Patient declined  EKG:  normal EKG, normal sinus rhythm, sinus bradycardia      Assessment:    This is a routine wellness examination for this patient .   Vision/Hearing screen Hearing Screening   500Hz  1000Hz  2000Hz  4000Hz   Right ear 20 25 25 20   Left ear 25 Fail Fail Fail   Vision Screening   Right eye Left eye Both eyes  Without correction 20/20 20/25 20/20   With correction        Goals   None     Depression Screen    01/17/2024    8:58 AM 07/14/2023    8:53 AM  PHQ 2/9 Scores  PHQ - 2  Score 0 0  PHQ- 9 Score 0 0     Fall Risk    01/17/2024    8:58 AM  Fall Risk   Falls in the past year? 0  Number falls in past yr: 0  Injury with Fall? 0  Risk for fall due to : No Fall Risks  Follow up Falls evaluation completed    Cognitive Function:        01/17/2024    8:50 AM  6CIT Screen  What Year? 0 points  What month? 0 points  What time? 0 points  Count back from 20 0 points  Months in reverse 2 points  Repeat phrase 2 points  Total Score 4 points    Patient Care Team: Herold Hadassah SQUIBB, MD as PCP - General (Family Medicine)     Plan:     Encounter for Medicare annual wellness exam -     EKG 12-Lead  Mixed hyperlipidemia -     Lipid panel  Benign essential HTN -     Basic metabolic panel with GFR  History of MI (myocardial infarction) -     Hemoglobin A1c  Osteoporosis screening -     DG Bone Density; Future   I have personally reviewed and noted the following in the patient's chart:   Medical and social history Use of alcohol, tobacco or illicit drugs  Current medications and supplements including opioid prescriptions. Patient is not currently taking opioid prescriptions. Functional ability and status Nutritional status Physical activity Advanced directives List of other physicians Hospitalizations, surgeries, and ER visits in previous 12 months Vitals Screenings to include cognitive, depression, and falls Referrals and appointments  In addition, I have reviewed and discussed with patient certain preventive protocols, quality metrics, and best practice recommendations. A written personalized care plan for preventive services as well as general preventive health recommendations were provided to patient.     Hadassah SQUIBB Herold, MD 01/23/2024

## 2024-01-18 LAB — BASIC METABOLIC PANEL WITH GFR
BUN/Creatinine Ratio: 16 (ref 12–28)
BUN: 14 mg/dL (ref 8–27)
CO2: 28 mmol/L (ref 20–29)
Calcium: 10.1 mg/dL (ref 8.7–10.3)
Chloride: 98 mmol/L (ref 96–106)
Creatinine, Ser: 0.89 mg/dL (ref 0.57–1.00)
Glucose: 75 mg/dL (ref 70–99)
Potassium: 4.3 mmol/L (ref 3.5–5.2)
Sodium: 140 mmol/L (ref 134–144)
eGFR: 72 mL/min/1.73 (ref >59)

## 2024-01-18 LAB — LIPID PANEL
Chol/HDL Ratio: 2.4 ratio (ref 0.0–4.4)
Cholesterol, Total: 256 mg/dL — ABNORMAL HIGH (ref 100–199)
HDL: 107 mg/dL (ref >39)
LDL Chol Calc (NIH): 139 mg/dL — ABNORMAL HIGH (ref 0–99)
Triglycerides: 61 mg/dL (ref 0–149)
VLDL Cholesterol Cal: 10 mg/dL (ref 5–40)

## 2024-01-18 LAB — HEMOGLOBIN A1C
Est. average glucose Bld gHb Est-mCnc: 94 mg/dL
Hgb A1c MFr Bld: 4.9 % (ref 4.8–5.6)

## 2024-01-20 ENCOUNTER — Ambulatory Visit: Payer: Self-pay | Admitting: Pediatrics

## 2024-01-23 ENCOUNTER — Encounter: Payer: Self-pay | Admitting: Pediatrics

## 2024-07-17 ENCOUNTER — Ambulatory Visit: Admitting: Pediatrics

## 2024-07-17 ENCOUNTER — Ambulatory Visit

## 2024-08-01 ENCOUNTER — Encounter: Admitting: Nurse Practitioner
